# Patient Record
Sex: Female | Born: 1955 | Race: White | Hispanic: No | Marital: Single
Health system: Southern US, Community
[De-identification: ages and names within clinical notes are randomized; demographics above are authoritative.]

---

## 2007-01-23 ENCOUNTER — Other Ambulatory Visit: Payer: Self-pay

## 2007-01-23 ENCOUNTER — Emergency Department: Payer: Self-pay | Admitting: Emergency Medicine

## 2010-01-19 ENCOUNTER — Inpatient Hospital Stay: Payer: Self-pay | Admitting: Surgery

## 2011-12-16 ENCOUNTER — Emergency Department: Payer: Self-pay | Admitting: Emergency Medicine

## 2012-01-30 ENCOUNTER — Emergency Department: Payer: Self-pay | Admitting: Emergency Medicine

## 2012-02-01 ENCOUNTER — Emergency Department: Payer: Self-pay | Admitting: Internal Medicine

## 2012-02-07 LAB — URINALYSIS, COMPLETE
Bilirubin,UR: NEGATIVE
Blood: NEGATIVE
Ketone: NEGATIVE
Leukocyte Esterase: NEGATIVE
Ph: 6 (ref 4.5–8.0)
Specific Gravity: 1.017 (ref 1.003–1.030)
Squamous Epithelial: 2

## 2012-02-07 LAB — CBC
HCT: 42.6 % (ref 35.0–47.0)
HGB: 14.2 g/dL (ref 12.0–16.0)
MCH: 28.5 pg (ref 26.0–34.0)
MCV: 86 fL (ref 80–100)
RBC: 4.98 10*6/uL (ref 3.80–5.20)
RDW: 14.3 % (ref 11.5–14.5)
WBC: 7.2 10*3/uL (ref 3.6–11.0)

## 2012-02-07 LAB — BASIC METABOLIC PANEL
Anion Gap: 6 — ABNORMAL LOW (ref 7–16)
Calcium, Total: 9.2 mg/dL (ref 8.5–10.1)
Chloride: 101 mmol/L (ref 98–107)
Co2: 30 mmol/L (ref 21–32)
Creatinine: 0.55 mg/dL — ABNORMAL LOW (ref 0.60–1.30)
EGFR (African American): >60
Osmolality: 273 (ref 275–301)

## 2012-02-10 ENCOUNTER — Inpatient Hospital Stay: Payer: Self-pay | Admitting: Internal Medicine

## 2012-02-10 LAB — BASIC METABOLIC PANEL
BUN: 11 mg/dL (ref 7–18)
Chloride: 100 mmol/L (ref 98–107)
Creatinine: 0.52 mg/dL — ABNORMAL LOW (ref 0.60–1.30)
Potassium: 3.8 mmol/L (ref 3.5–5.1)
Sodium: 137 mmol/L (ref 136–145)

## 2012-02-14 LAB — CREATININE, SERUM
Creatinine: 0.51 mg/dL — ABNORMAL LOW (ref 0.60–1.30)
EGFR (African American): >60
EGFR (Non-African Amer.): >60

## 2013-10-06 ENCOUNTER — Ambulatory Visit: Payer: Self-pay
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
## 2014-09-21 NOTE — Consult Note (Signed)
59 year old female with right hip injury and persistent right hip pain. reviewed and history reviewed. conservative treatment, no surgery indicated. Pounds buck's traction ordered right leg when in bed. ordered for up into chair daily non weight bearing and ambulation when tolerated toe touch weight bearing only.  Electronic Signatures: Clare Gandy (MD)  (Signed on 05-Sep-13 19:15)  Authored  Last Updated: 05-Sep-13 19:15 by Clare Gandy (MD)
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
## 2016-02-28 ENCOUNTER — Ambulatory Visit
Admission: RE | Admit: 2016-02-28 | Discharge: 2016-02-28 | Disposition: A | Discharge status: Home / Self Care | Payer: Medicaid Other | Source: Ambulatory Visit | Attending: Internal Medicine | Admitting: Internal Medicine

## 2016-02-28 ENCOUNTER — Other Ambulatory Visit: Payer: Self-pay | Admitting: Internal Medicine

## 2016-02-28 DIAGNOSIS — R059 Cough, unspecified: Secondary | ICD-10-CM

## 2016-02-28 DIAGNOSIS — M47812 Spondylosis without myelopathy or radiculopathy, cervical region: Secondary | ICD-10-CM | POA: Diagnosis not present

## 2016-02-28 DIAGNOSIS — M1990 Unspecified osteoarthritis, unspecified site: Secondary | ICD-10-CM

## 2016-02-28 DIAGNOSIS — I6523 Occlusion and stenosis of bilateral carotid arteries: Secondary | ICD-10-CM | POA: Diagnosis not present

## 2016-02-28 DIAGNOSIS — M47816 Spondylosis without myelopathy or radiculopathy, lumbar region: Secondary | ICD-10-CM | POA: Diagnosis not present

## 2016-02-28 DIAGNOSIS — M5136 Other intervertebral disc degeneration, lumbar region: Secondary | ICD-10-CM | POA: Diagnosis not present

## 2016-02-28 DIAGNOSIS — J449 Chronic obstructive pulmonary disease, unspecified: Secondary | ICD-10-CM | POA: Diagnosis not present

## 2016-02-28 DIAGNOSIS — R05 Cough: Secondary | ICD-10-CM

## 2016-02-28 DIAGNOSIS — M5030 Other cervical disc degeneration, unspecified cervical region: Secondary | ICD-10-CM | POA: Insufficient documentation

## 2016-02-28 DIAGNOSIS — M4313 Spondylolisthesis, cervicothoracic region: Secondary | ICD-10-CM | POA: Diagnosis not present

## 2016-11-27 IMAGING — CR DG CERVICAL SPINE 2 OR 3 VIEWS
5 series · 5 of 5 positions shown · non-contrast
Comparison: Cervical spine CT 01/30/2012.

CLINICAL DATA: Chronic left posterior cervical pain over the past
5-6 months. No known injuries.

EXAM:
CERVICAL SPINE - 2-3 VIEW

[c-spine lat]
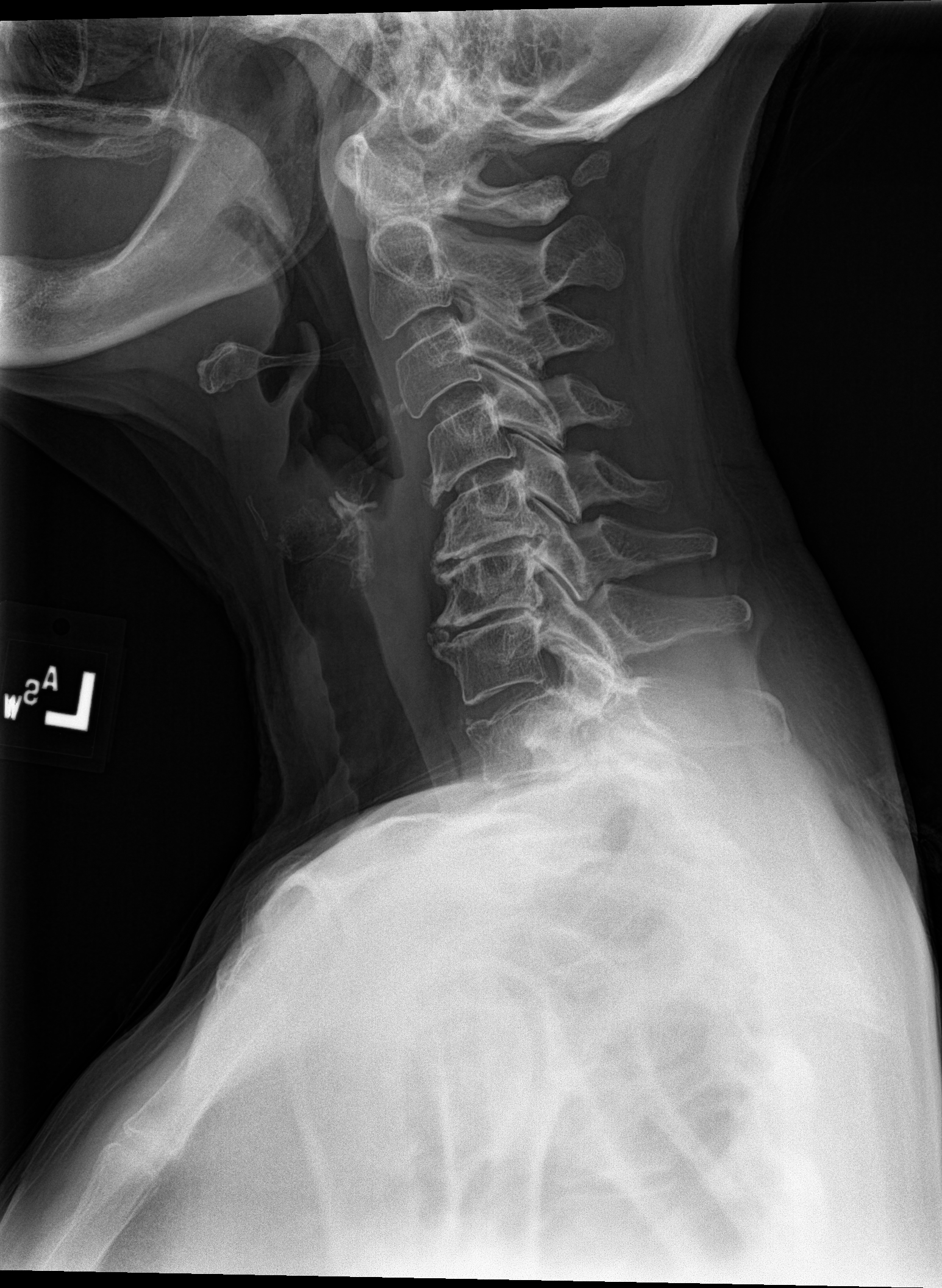

[c-spine ap]
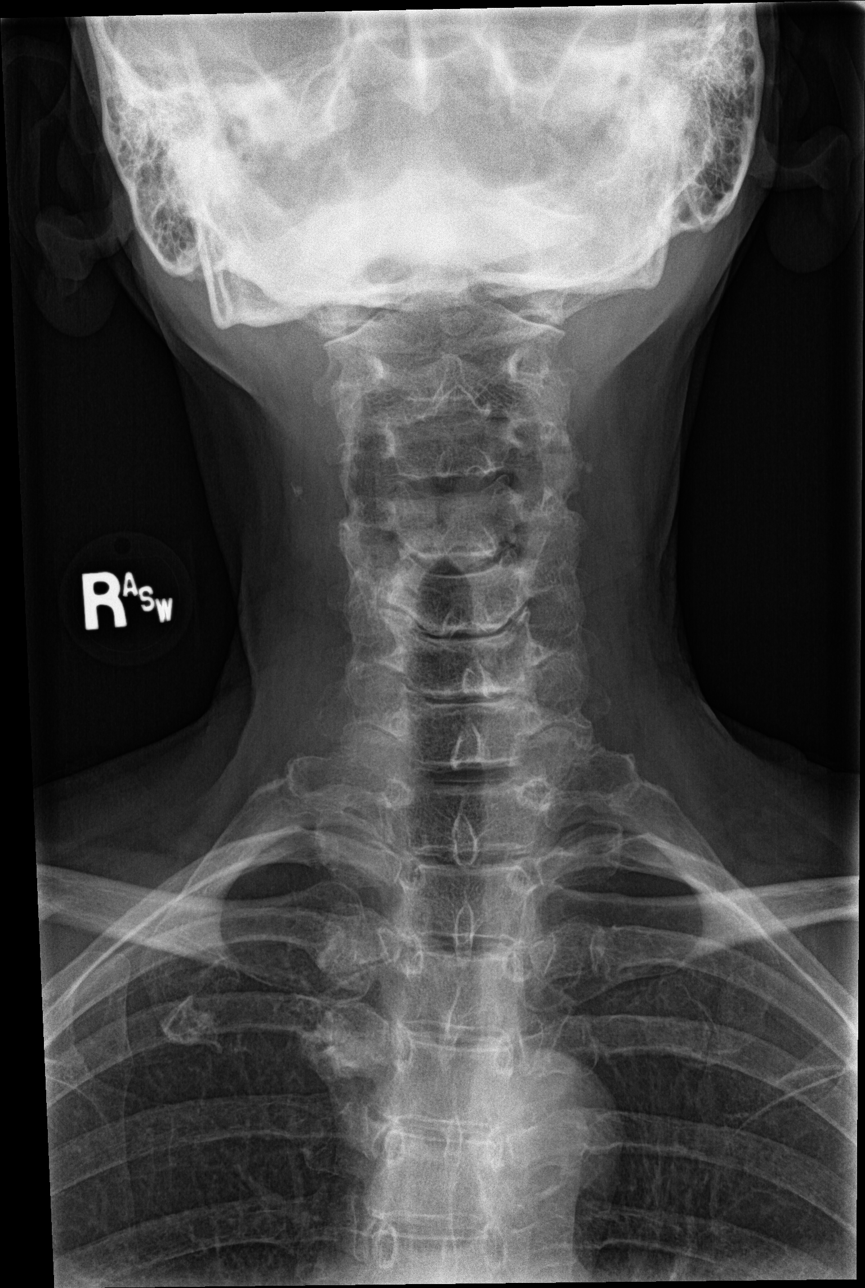

[c-spine open mouth (1 of 2)]
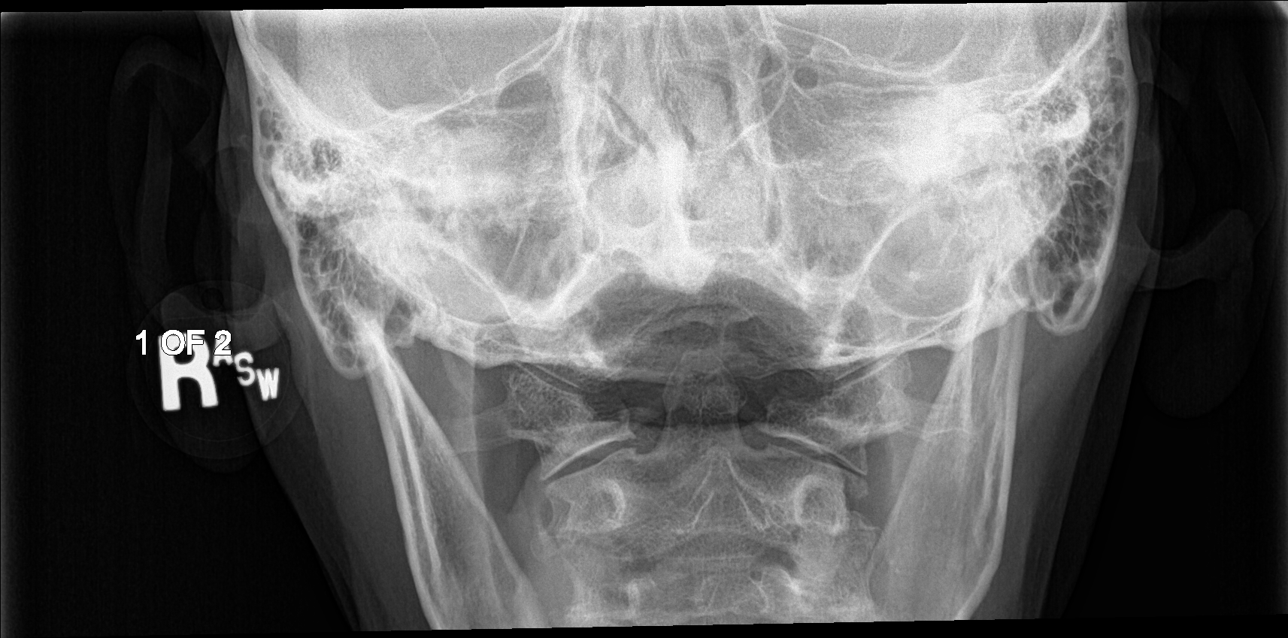

[ct-spine swimmers]
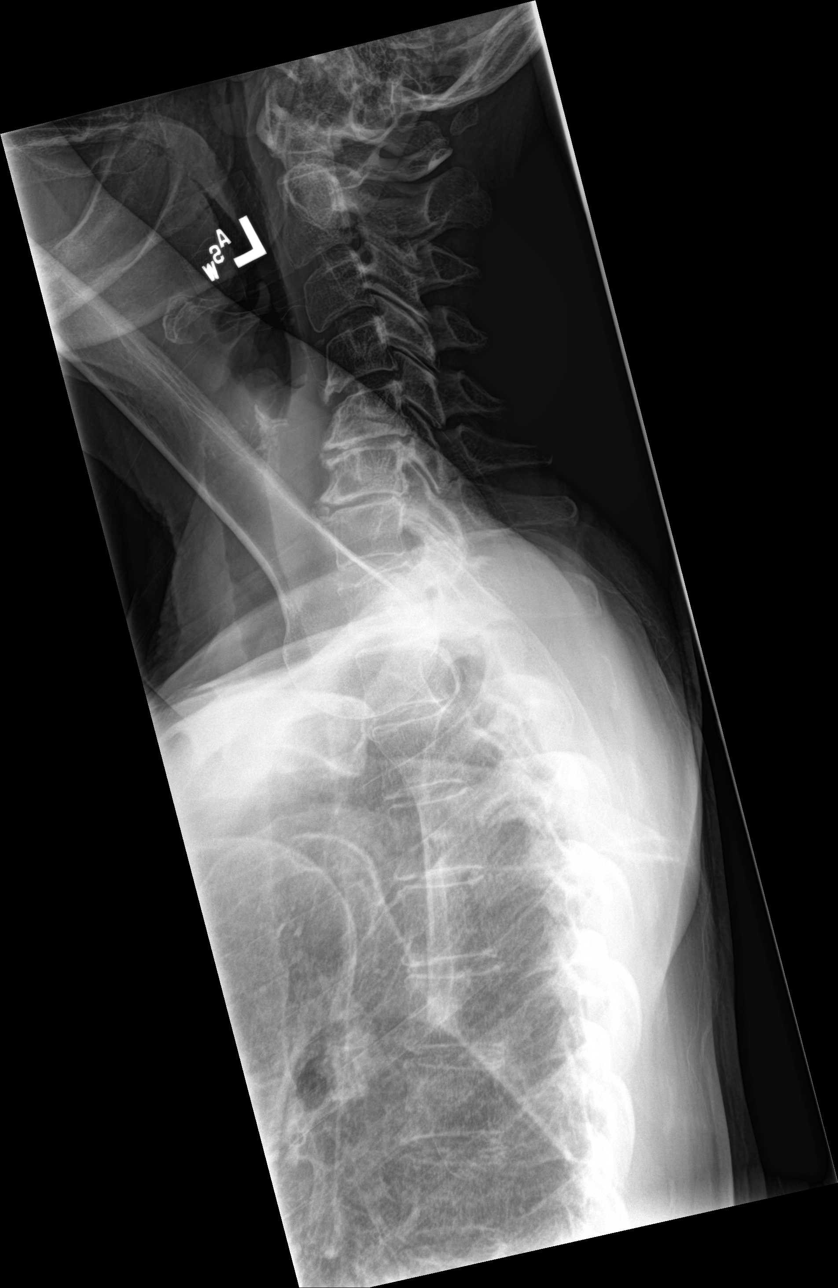

[c-spine open mouth (2 of 2)]
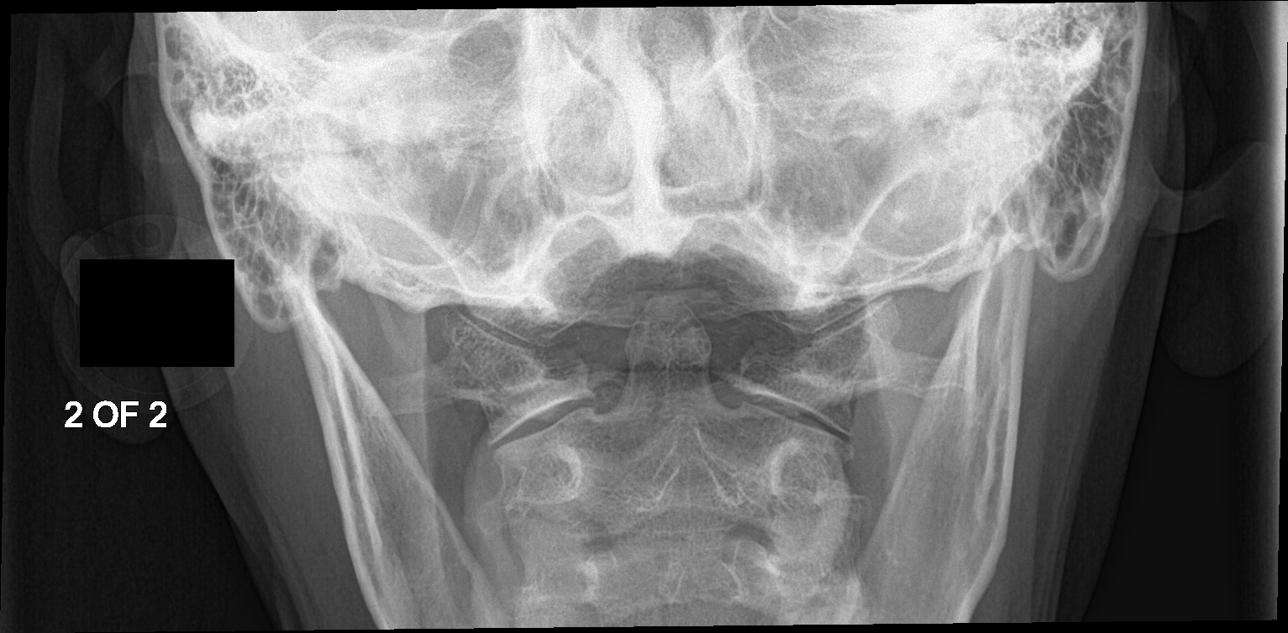

[5 of 5 positions shown; findings below may reference images not displayed]

FINDINGS: Reversal of the usual cervical lordosis centered at C5-6.
Degenerative slight grade 1 spondylolisthesis of C7 on T1 measuring
approximately 4 mm due to facet degenerative changes at this level.
Severe disc space narrowing and endplate hypertrophic changes at
C5-6 and C6-7, similar to the prior CT. Mild disc space narrowing at
C4-5, also unchanged. Normal prevertebral soft tissues. Facet
degenerative changes at C6-7 and C7-T1. No static evidence of
instability.

Note made of atherosclerotic calcification of the cervical carotid
arteries bilaterally.
IMPRESSION: 1. No acute or subacute osseous abnormality.
2. Reversal of the usual lordosis centered at C5-6 likely reflects
positioning and/or spasm.
3. Severe degenerative disc disease and spondylosis at C5-6 and
C6-7. Mild degenerative disc disease at C4-5. These findings are not
changed since the prior CT from January 2012. Facet degenerative changes at C6-7 and C7-T1 with degenerative
grade 1 spondylolisthesis of C7 on T1 measuring approximately 4 mm,
not visible on the prior CT.
5. Bilateral cervical carotid atherosclerosis, somewhat advanced for
patient age.

## 2017-01-02 ENCOUNTER — Ambulatory Visit
Admission: RE | Admit: 2017-01-02 | Discharge: 2017-01-02 | Disposition: A | Discharge status: Home / Self Care | Payer: Medicaid Other | Source: Ambulatory Visit | Attending: Internal Medicine | Admitting: Internal Medicine

## 2017-01-02 ENCOUNTER — Other Ambulatory Visit: Payer: Self-pay | Admitting: Internal Medicine

## 2017-01-02 DIAGNOSIS — R52 Pain, unspecified: Secondary | ICD-10-CM

## 2017-01-02 DIAGNOSIS — F17200 Nicotine dependence, unspecified, uncomplicated: Secondary | ICD-10-CM | POA: Insufficient documentation

## 2017-01-02 DIAGNOSIS — R05 Cough: Secondary | ICD-10-CM

## 2017-01-02 DIAGNOSIS — M069 Rheumatoid arthritis, unspecified: Secondary | ICD-10-CM | POA: Diagnosis not present

## 2017-01-02 DIAGNOSIS — Z8781 Personal history of (healed) traumatic fracture: Secondary | ICD-10-CM | POA: Insufficient documentation

## 2017-01-02 DIAGNOSIS — G629 Polyneuropathy, unspecified: Secondary | ICD-10-CM | POA: Diagnosis not present

## 2017-01-02 DIAGNOSIS — R059 Cough, unspecified: Secondary | ICD-10-CM

## 2017-01-02 DIAGNOSIS — J449 Chronic obstructive pulmonary disease, unspecified: Secondary | ICD-10-CM | POA: Diagnosis not present

## 2017-10-02 IMAGING — CR DG CHEST W/ LORDOTIC
3 series · 4 of 4 positions shown · non-contrast
Comparison: 02/28/2016 ; 01/23/2007 ; right rib radiographic series
-01/05/2012

CLINICAL DATA: Anterior chest pain and tenderness with dry coughing
spells for the past 2 months. Shortness of breath. Current smoker.
History of bronchitis.

EXAM:
CHEST - 1 VIEW WITH LORDOTIC VIEW(S)

[chest pa]
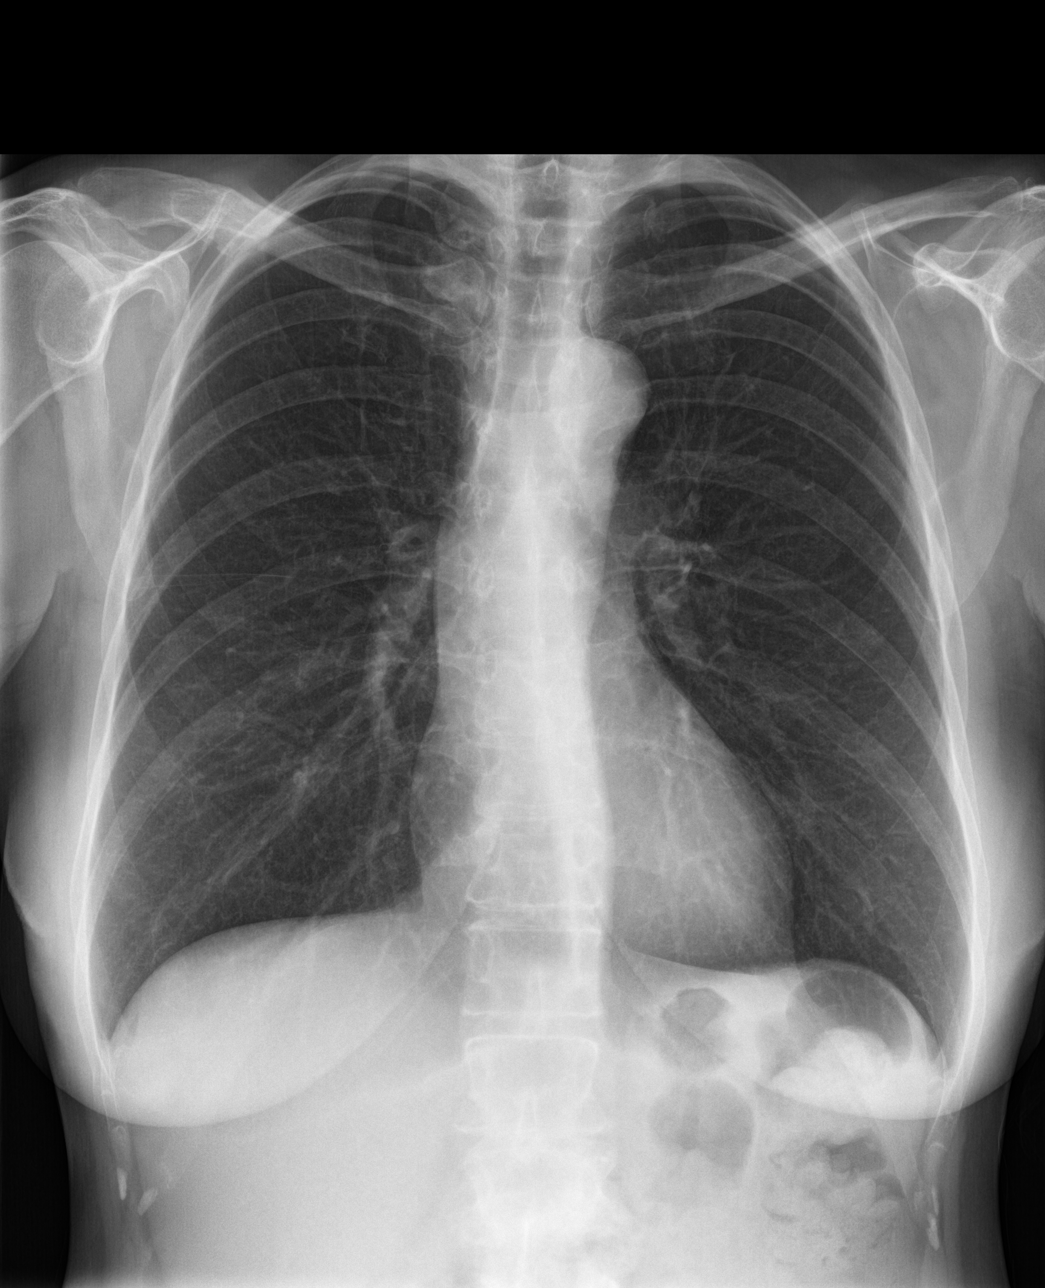

[chest lat]
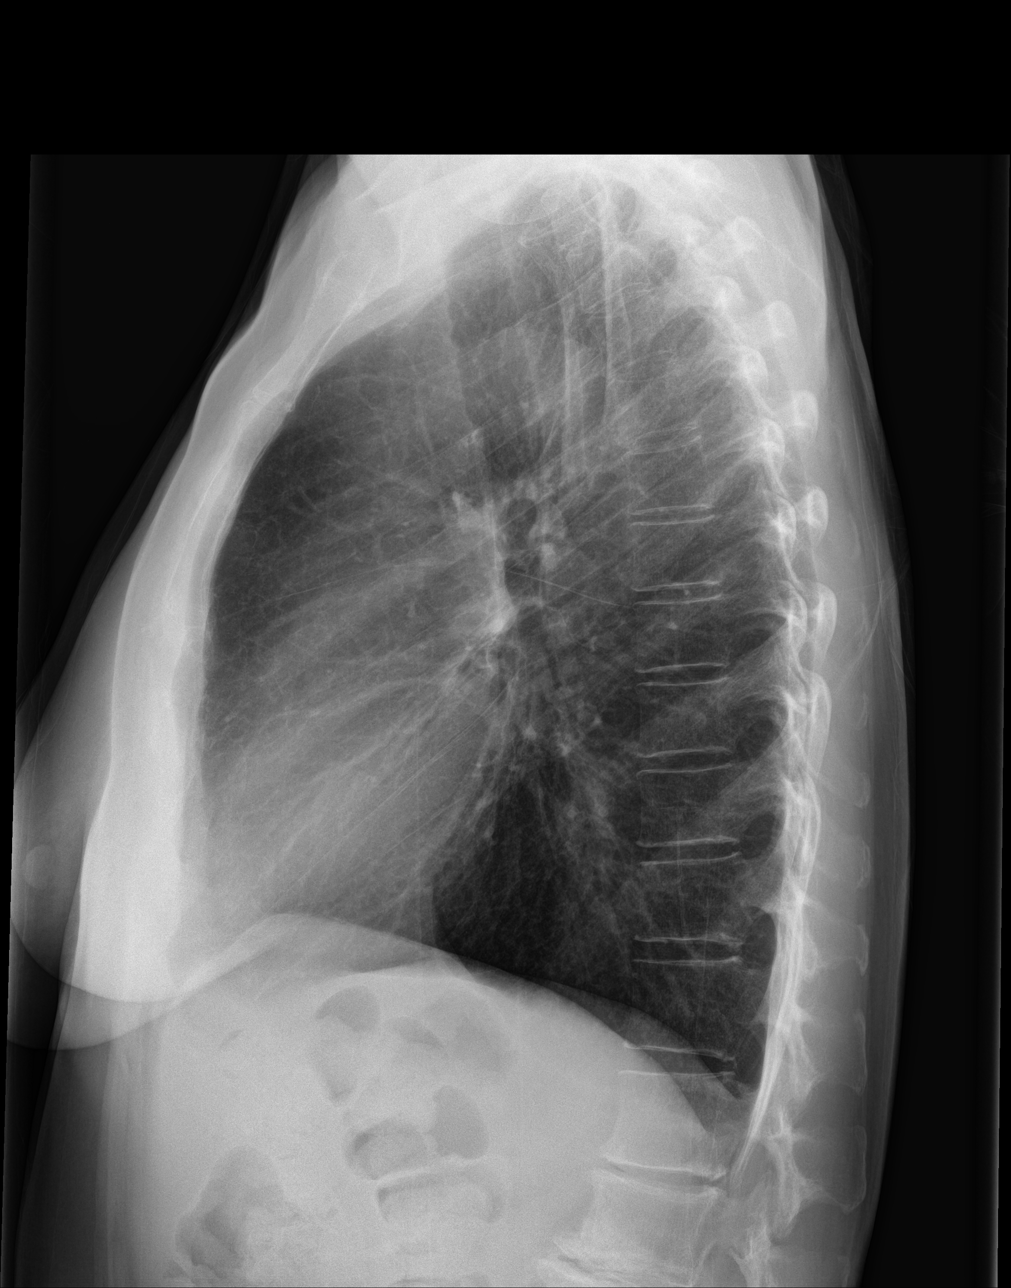

[Series 3: chest ap · 0.14mm/px · 2 of 2 slices shown]
[im 1/2]
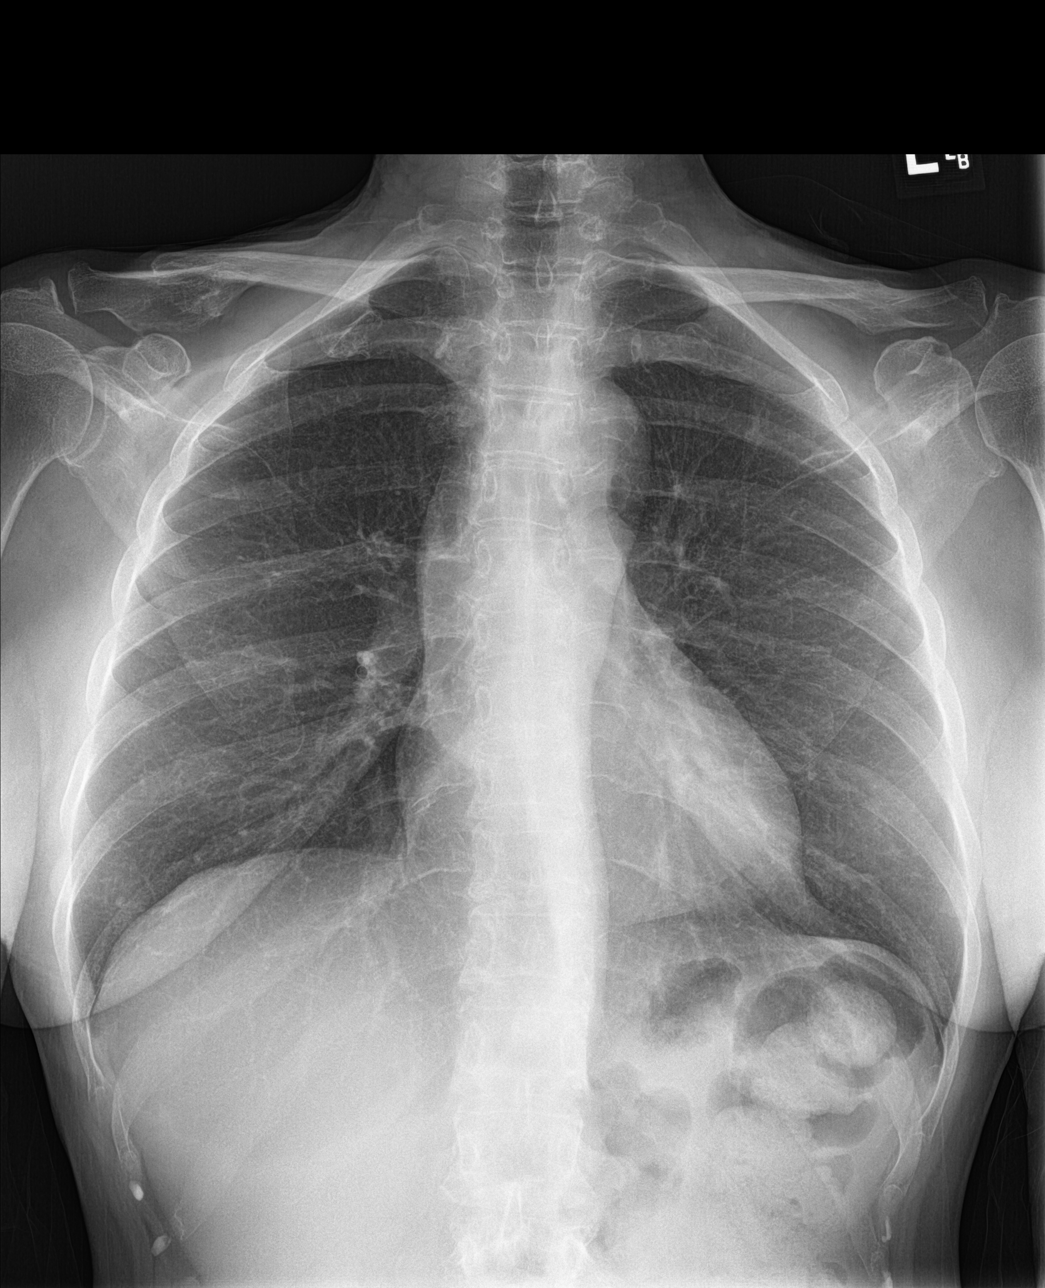
[im 2/2]
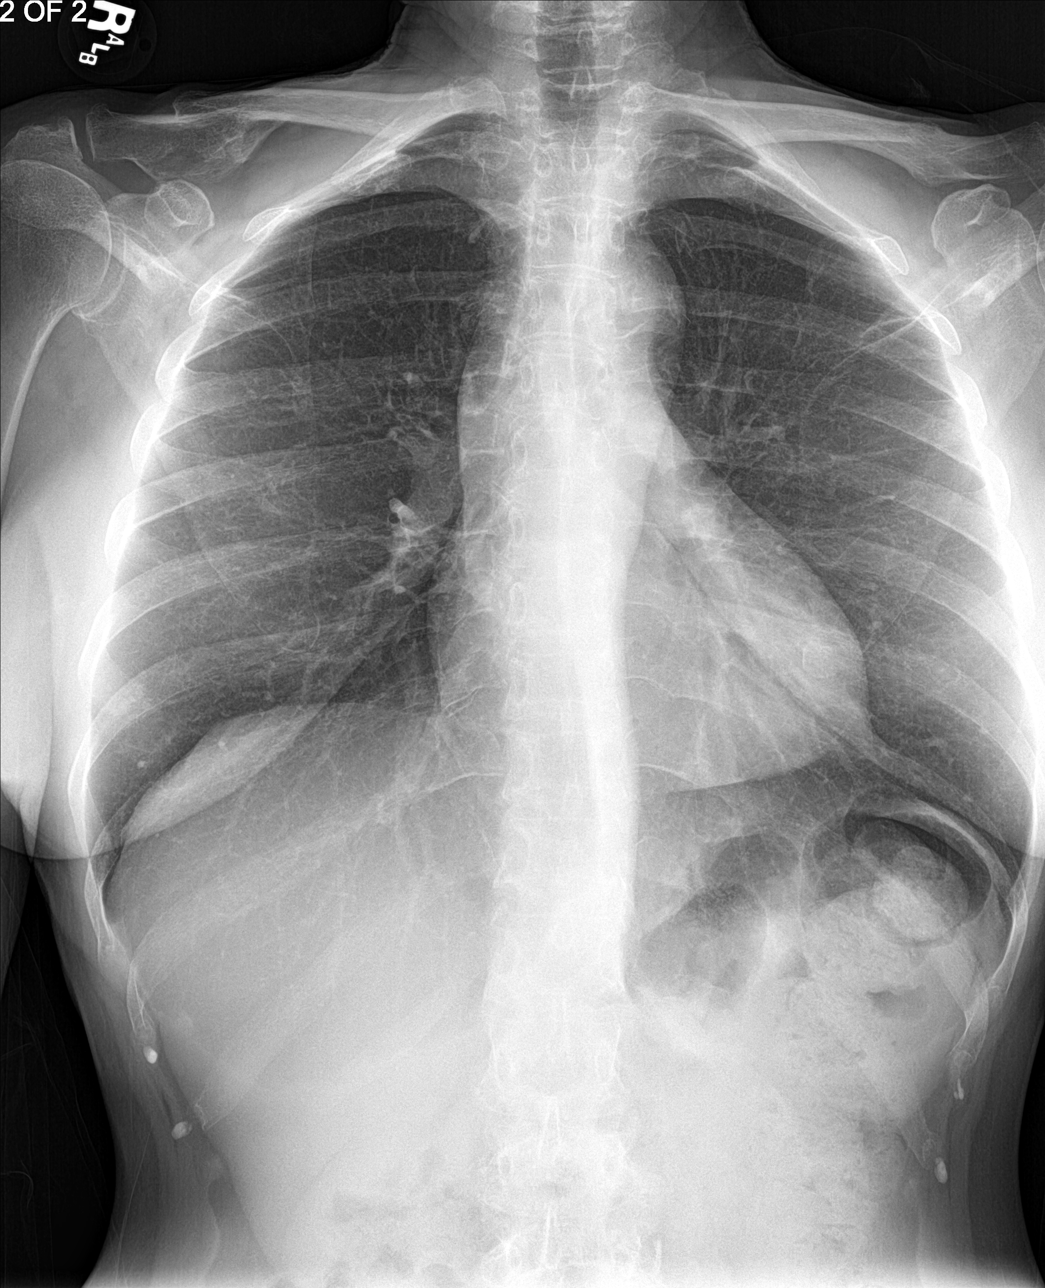

[4 of 4 positions shown; findings below may reference images not displayed]

FINDINGS: Normal cardiac silhouette and mediastinal contours. The lungs appear
hyperexpanded with flattening the diaphragms. No focal airspace
opacities. No pleural effusion or pneumothorax. No evidence of
edema. No acute osseus abnormalities. Old right distal clavicular
fracture. Degenerative change within the superior aspect of the
lumbar spine, incompletely evaluated.
IMPRESSION: 1. Hyperexpanded lungs without superimposed acute cardiopulmonary
disease.
2. Old right clavicular fracture.
